# Patient Record
Sex: Male | Born: 1970 | Race: Black or African American | Hispanic: No | Marital: Married | Smoking: Former smoker
Health system: Southern US, Community
[De-identification: ages and names within clinical notes are randomized; demographics above are authoritative.]

---

## 2005-07-13 ENCOUNTER — Emergency Department (HOSPITAL_COMMUNITY): Admission: EM | Admit: 2005-07-13 | Discharge: 2005-07-13 | Payer: Self-pay | Admitting: Emergency Medicine

## 2006-09-07 ENCOUNTER — Emergency Department (HOSPITAL_COMMUNITY): Admission: EM | Admit: 2006-09-07 | Discharge: 2006-09-07 | Payer: Self-pay | Admitting: Family Medicine

## 2013-03-21 ENCOUNTER — Emergency Department (HOSPITAL_COMMUNITY): Payer: BC Managed Care – PPO

## 2013-03-21 ENCOUNTER — Encounter (HOSPITAL_COMMUNITY): Payer: Self-pay | Admitting: Emergency Medicine

## 2013-03-21 ENCOUNTER — Emergency Department (HOSPITAL_COMMUNITY)
Admission: EM | Admit: 2013-03-21 | Discharge: 2013-03-21 | Disposition: A | Discharge status: Home / Self Care | Payer: BC Managed Care – PPO | Attending: Emergency Medicine | Admitting: Emergency Medicine

## 2013-03-21 DIAGNOSIS — S298XXA Other specified injuries of thorax, initial encounter: Secondary | ICD-10-CM | POA: Insufficient documentation

## 2013-03-21 DIAGNOSIS — Y92410 Unspecified street and highway as the place of occurrence of the external cause: Secondary | ICD-10-CM | POA: Insufficient documentation

## 2013-03-21 DIAGNOSIS — Y9389 Activity, other specified: Secondary | ICD-10-CM | POA: Insufficient documentation

## 2013-03-21 DIAGNOSIS — R0789 Other chest pain: Secondary | ICD-10-CM

## 2013-03-21 NOTE — ED Notes (Signed)
Bed: RESB Expected date: 03/21/13 Expected time: 12:22 PM Means of arrival: Ambulance Comments: Hypertension, chest pain
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
## 2013-08-29 ENCOUNTER — Encounter: Payer: Self-pay | Admitting: *Deleted

## 2013-08-29 DIAGNOSIS — R9431 Abnormal electrocardiogram [ECG] [EKG]: Secondary | ICD-10-CM | POA: Insufficient documentation

## 2013-08-29 DIAGNOSIS — R002 Palpitations: Secondary | ICD-10-CM | POA: Insufficient documentation

## 2016-06-11 ENCOUNTER — Other Ambulatory Visit (HOSPITAL_COMMUNITY): Payer: Self-pay | Admitting: Nurse Practitioner

## 2016-06-11 DIAGNOSIS — B181 Chronic viral hepatitis B without delta-agent: Secondary | ICD-10-CM

## 2016-08-30 ENCOUNTER — Ambulatory Visit (HOSPITAL_COMMUNITY): Payer: BLUE CROSS/BLUE SHIELD

## 2016-09-12 ENCOUNTER — Other Ambulatory Visit (HOSPITAL_COMMUNITY): Payer: Self-pay

## 2016-10-10 ENCOUNTER — Ambulatory Visit (HOSPITAL_COMMUNITY): Payer: BLUE CROSS/BLUE SHIELD

## 2021-05-21 ENCOUNTER — Telehealth (HOSPITAL_COMMUNITY): Payer: Self-pay

## 2021-05-21 ENCOUNTER — Ambulatory Visit (HOSPITAL_COMMUNITY)
Admission: RE | Admit: 2021-05-21 | Discharge: 2021-05-21 | Disposition: A | Discharge status: Home / Self Care | Payer: 59 | Source: Ambulatory Visit | Attending: Emergency Medicine | Admitting: Emergency Medicine

## 2021-05-21 ENCOUNTER — Encounter (HOSPITAL_COMMUNITY): Payer: Self-pay

## 2021-05-21 ENCOUNTER — Other Ambulatory Visit: Payer: Self-pay

## 2021-05-21 VITALS — BP 168/76 | HR 76 | Temp 98.2°F | Resp 15

## 2021-05-21 DIAGNOSIS — M79602 Pain in left arm: Secondary | ICD-10-CM

## 2021-05-21 MED ORDER — ETODOLAC 400 MG PO TABS: 400.0000 mg | ORAL_TABLET | Freq: Two times a day (BID) | ORAL | 0 refills | Status: DC

## 2021-05-21 NOTE — Discharge Instructions (Addendum)
Do not lift anything heavy for the next few days.  You can take the Lodine twice a day as needed for pain. You can also take Tylenol as needed.  You may wear an arm brace or Ace bandage as needed for comfort.  Rest as much as possible Ice for 10-15 minutes every 4-6 hours as needed for pain and swelling Compression- use an ace bandage or splint for comfort Elevate above your hip/heart when sitting and laying down  Follow up with sports medicine or orthopedics if symptoms do not improve in the next few days.

## 2021-05-21 NOTE — ED Provider Notes (Signed)
[facility]    CSN: [id number] Arrival date & time: 05/21/21  1336      History   Chief Complaint Chief Complaint  Patient presents with   Arm Pain    HPI Joshua Hinton is a 50 y.o. male.   Patient here for evaluation of left forearm pain that has been ongoing for the past 2 weeks.  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  Reports taking Advil with minimal symptom relief.  Reports pain improves with rest and is worse with movement.  Reports pain is worse when flexing arm but patient does have full range of motion.  Denies any trauma, injury, or other precipitating event.  Denies any specific alleviating or aggravating factors.  Denies any fevers, chest pain, shortness of breath, N/V/D, numbness, tingling, weakness, abdominal pain, or headaches.    The history is provided by the patient.  Arm Pain   History reviewed. No pertinent past medical history.  Patient Active Problem List   Diagnosis Date Noted   Palpitations 08/29/2013   Abnormal EKG 08/29/2013    History reviewed. No pertinent surgical history.     Home Medications    Prior to Admission medications   Medication Sig Start Date End Date Taking? Authorizing Provider  etodolac (LODINE) 400 MG tablet Take 1 tablet (400 mg total) by mouth 2 (two) times daily. 05/21/21  Yes Ivette Loyal, NP    Family History Family History  Problem Relation Age of Onset   Cancer - Other Father     Social History Social History   Tobacco Use   Smoking status: Former    Types: Cigarettes    Quit date: 01/27/2003    Years since quitting: 18.3  Substance Use Topics   Alcohol use: Yes   Drug use: No     Allergies   Patient has no known allergies.   Review of Systems Review of Systems  Musculoskeletal:  Positive for arthralgias.  All other systems reviewed and are negative.   Physical Exam Triage Vital Signs ED Triage Vitals  Enc Vitals Group     BP  05/21/21 1415 (!) 168/76     Pulse Rate 05/21/21 1415 76     Resp 05/21/21 1415 15     Temp 05/21/21 1415 98.2 F (36.8 C)     Temp Source 05/21/21 1415 Oral     SpO2 05/21/21 1415 97 %     Weight --      Height --      Head Circumference --      Peak Flow --      Pain Score 05/21/21 1413 10     Pain Loc --      Pain Edu? --      Excl. in [place]? --    No data found.  Updated Vital Signs BP (!) 168/76 (BP Location: Right Arm)   Pulse 76   Temp 98.2 F (36.8 C) (Oral)   Resp 15   SpO2 97%   Visual Acuity Right Eye Distance:   Left Eye Distance:   Bilateral Distance:    Right Eye Near:   Left Eye Near:    Bilateral Near:     Physical Exam Vitals and nursing note reviewed.  Constitutional:      General: He is not in acute distress.    Appearance: Normal appearance. He is not ill-appearing, toxic-appearing or diaphoretic.  HENT:     Head: Normocephalic  and atraumatic.  Eyes:     Conjunctiva/sclera: Conjunctivae normal.  Cardiovascular:     Rate and Rhythm: Normal rate.     Pulses: Normal pulses.  Pulmonary:     Effort: Pulmonary effort is normal.  Abdominal:     General: Abdomen is flat.  Musculoskeletal:        General: Normal range of motion.     Left elbow: Swelling present. No effusion or lacerations. Normal range of motion. Tenderness (tenderness and mild swelling to proximal forearm) present.     Cervical back: Normal range of motion.  Skin:    General: Skin is warm and dry.  Neurological:     General: No focal deficit present.     Mental Status: He is alert and oriented to person, place, and time.  Psychiatric:        Mood and Affect: Mood normal.     UC Treatments / Results  Labs (all labs ordered are listed, but only abnormal results are displayed) Labs Reviewed - No data to display  EKG   Radiology No results found.  Procedures Procedures (including critical care time)  Medications Ordered in UC Medications - No data to  display  Initial Impression / Assessment and Plan / UC Course  I have reviewed the triage vital signs and the nursing notes.  Pertinent labs & imaging results that were available during my care of the patient were reviewed by me and considered in my medical decision making (see chart for details).    Assessment negative for red flags or concerns.  Left arm pain possibly due to pulled muscle.  Recommend not lifting anything heavy for the next few weeks.  May take Lodine twice a day as needed for pain.  May also take Tylenol.  Ace bandage or arm sling as needed for comfort.  Recommend rest, ice, compression, and elevation.  Follow-up with orthopedics if symptoms not improve in the next few days. Final Clinical Impressions(s) / UC Diagnoses   Final diagnoses:  Left arm pain     Discharge Instructions      Do not lift anything heavy for the next few days.  You can take the Lodine twice a day as needed for pain. You can also take Tylenol as needed.  You may wear an arm brace or Ace bandage as needed for comfort.  Rest as much as possible Ice for 10-15 minutes every 4-6 hours as needed for pain and swelling Compression- use an ace bandage or splint for comfort Elevate above your hip/heart when sitting and laying down  Follow up with sports medicine or orthopedics if symptoms do not improve in the next few days.       ED Prescriptions     Medication Sig Dispense Auth. Provider   etodolac (LODINE) 400 MG tablet Take 1 tablet (400 mg total) by mouth 2 (two) times daily. 28 tablet Ivette Loyal, NP      PDMP not reviewed this encounter.   Ivette Loyal, NP 05/21/21 [phone]

## 2021-05-21 NOTE — ED Triage Notes (Signed)
2 fridays ago pt was at work lifting something with another coworker and heard a pop in left arm. Taking Advil every 6 hours but not helping.

## 2021-06-01 ENCOUNTER — Other Ambulatory Visit (HOSPITAL_BASED_OUTPATIENT_CLINIC_OR_DEPARTMENT_OTHER): Payer: Self-pay | Admitting: Orthopaedic Surgery

## 2021-06-01 DIAGNOSIS — M25522 Pain in left elbow: Secondary | ICD-10-CM

## 2021-06-02 ENCOUNTER — Ambulatory Visit (HOSPITAL_BASED_OUTPATIENT_CLINIC_OR_DEPARTMENT_OTHER): Payer: 59 | Admitting: Orthopaedic Surgery

## 2021-07-06 ENCOUNTER — Encounter: Payer: Self-pay | Admitting: Orthopedic Surgery

## 2021-07-06 ENCOUNTER — Other Ambulatory Visit: Payer: Self-pay

## 2021-07-06 ENCOUNTER — Ambulatory Visit (INDEPENDENT_AMBULATORY_CARE_PROVIDER_SITE_OTHER): Payer: 59 | Admitting: Orthopedic Surgery

## 2021-07-06 DIAGNOSIS — S46212A Strain of muscle, fascia and tendon of other parts of biceps, left arm, initial encounter: Secondary | ICD-10-CM | POA: Insufficient documentation

## 2021-07-06 NOTE — Progress Notes (Signed)
Office Visit Note   Patient: Joshua Hinton           Date of Birth: 1971/06/15           MRN: [id number] Visit Date: 07/06/2021              Requested by: No referring provider defined for this encounter. PCP: Pcp, No   Assessment & Plan: Visit Diagnoses:  1. Rupture of left distal biceps tendon, initial encounter     Plan: Reviewed the MRI from outside hospital which demonstrates complete rupture of the left distal biceps tendon.  Discussed surgical treatment for this injury including nature of procedure and expected postoperative rehab and recovery. I will send him to my partner Dr. Merton Border who does more of these procedures.  Follow-Up Instructions: No follow-ups on file.   Orders:  Orders Placed This Encounter  Procedures   Ambulatory referral to Orthopedic Surgery   No orders of the defined types were placed in this encounter.     Procedures: No procedures performed   Clinical Data: No additional findings.   Subjective: Chief Complaint  Patient presents with   Right Elbow - Injury, Pain    This is a 50 year old left-hand-dominant male who presents with left elbow pain after an injury on October 25.  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  Since that time he had significant difficulty flexing the elbow.  He is seen in urgent care initially.  An MRI was obtained at [facility] on 11/25.  He is now 11 weeks out from injury.  He has been unable to perform his usual work duties secondary to the injury.  The pain is centered at the [facility] fossa of the radius distally into the proximal forearm.  Pain is worse with attempted flexion of the elbow or supination and flexed position.  Injury   Review of Systems   Objective: Vital Signs: There were no vitals taken for this visit.  Physical Exam Constitutional:      Appearance: Normal appearance.  Cardiovascular:     Rate and Rhythm: Normal  rate.     Pulses: Normal pulses.  Pulmonary:     Effort: Pulmonary effort is normal.  Skin:    General: Skin is warm and dry.     Capillary Refill: Capillary refill takes less than 2 seconds.  Neurological:     Mental Status: He is alert.    Left Elbow Exam   Tenderness  Left elbow tenderness location: TTP at [facility] fossa w/ moderate swelling/fullness.   Other  Erythema: absent Sensation: normal Pulse: present  Comments:  Questionable "hook test." ROM from 0-100 degrees but significant difficultly beyond 45 degrees of flexion.  Able to supinate the forearm with the elbow in a flexed position but painful.      Specialty Comments:  No specialty comments available.  Imaging: No results found.   PMFS History: Patient Active Problem List   Diagnosis Date Noted   Rupture of left distal biceps tendon 07/06/2021   Palpitations 08/29/2013   Abnormal EKG 08/29/2013   No past medical history on file.  Family History  Problem Relation Age of Onset   Cancer - Other Father     No past surgical history on file. Social History   Occupational History   Not on file  Tobacco Use   Smoking status: Former    Types: Cigarettes    Quit  date: 01/27/2003    Years since quitting: 18.4   Smokeless tobacco: Not on file  Substance and Sexual Activity   Alcohol use: Yes   Drug use: No   Sexual activity: Not on file

## 2021-07-18 ENCOUNTER — Ambulatory Visit (INDEPENDENT_AMBULATORY_CARE_PROVIDER_SITE_OTHER): Payer: 59 | Admitting: Orthopaedic Surgery

## 2021-07-18 ENCOUNTER — Other Ambulatory Visit: Payer: Self-pay

## 2021-07-18 DIAGNOSIS — S46212A Strain of muscle, fascia and tendon of other parts of biceps, left arm, initial encounter: Secondary | ICD-10-CM | POA: Diagnosis not present

## 2021-07-18 NOTE — Progress Notes (Signed)
Chief Complaint: Left elbow pain     History of Present Illness:    Joshua Hinton is a 50 y.o. male presents with left distal biceps tear after he was lifting an heavy object on May 14, 2021.  At that time he felt a pop and subsequent pain in the elbow.  An MRI was performed at [facility] which showed complete rupture of the distal biceps.  He was previously seen by Dr. Frazier Butt who recommend possible surgical fixation of his biceps.  He is here today for further discussion.  He has been on multiple medications for this including several NSAIDs as well as antispasmodics.  None of these appear to be working.  He does still notice weakness with basic activities and flexion of the left arm.  He is a Education administrator by [employer] History:   None  PMH/PSH/Family History/Social History/Meds/Allergies:   No past medical history on file. No past surgical history on file. Social History   Socioeconomic History   Marital status: Married    Spouse name: Not on file   Number of children: Not on file   Years of education: Not on file   Highest education level: Not on file  Occupational History   Not on file  Tobacco Use   Smoking status: Former    Types: Cigarettes    Quit date: 01/27/2003    Years since quitting: 18.4   Smokeless tobacco: Not on file  Substance and Sexual Activity   Alcohol use: Yes   Drug use: No   Sexual activity: Not on file  Other Topics Concern   Not on file  Social History Narrative   Not on file   Social Determinants of Health   Financial Resource Strain: Not on file  Food Insecurity: Not on file  Transportation Needs: Not on file  Physical Activity: Not on file  Stress: Not on file  Social Connections: Not on file   Family History  Problem Relation Age of Onset   Cancer - Other Father    No Known Allergies Current Outpatient Medications  Medication Sig Dispense Refill   etodolac (LODINE) 400 MG tablet Take  1 tablet (400 mg total) by mouth 2 (two) times daily. 28 tablet 0   No current facility-administered medications for this visit.   No results found.  Review of Systems:   A ROS was performed including pertinent positives and negatives as documented in the HPI.  Physical Exam :   Constitutional: NAD and appears stated age Neurological: Alert and oriented Psych: Appropriate affect and cooperative There were no vitals taken for this visit.   Comprehensive Musculoskeletal Exam:    Inspection Right Left  Skin No atrophy or gross abnormalities appreciated No atrophy or gross abnormalities appreciated  Palpation    Tenderness None None  Crepitus None None  Range of Motion    Flexion (passive) 160 160  Flexion (active) 160 160  Extension 0 0  Pronation normal normal  Supination normal normal   Strength    Flexion  5/5 5/5  Extension 5/5 5/5  Pronation  5/5 5/5  Supination  5/5 5/5  Special Tests    Lateral epicondyle pain with resisted wrist extension Negative Negative  Medial epicondyle pain with resisted wrist flexion  Negative Negative  Tinel test over cubital  tunnel  Negative  Negative   Instability    Generalized Laxity No No  Varus stress No instability No instability  Valgus stress  No instability No instability  Reflexes    Triceps Normal (2/4) Normal (2/4)  Brachioradialis Normal (2/4) Normal (2/4)  Biceps Normal (2/4) Normal (2/4)  Neurologic    Fires PIN, radial, median, ulnar, musculocutaneous, axillary, suprascapular, long thoracic, and spinal accessory innervated muscles. No abnormal sensibility.  Vascular/Lymphatic    Radial Pulse 2+ 2+  Cervical Exam    Patient has symmetric cervical range of motion with Negative Spurling's test.   No biceps palpated distally with hook test.  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   Assessment:   50 year old male with left full-thickness distal biceps tear after he was pulling up a heavy object.  I discussed with him operative versus nonoperative management.  At this time he is recovered to the effect that I do believe he is gotten the majority of the recovery that he would without surgery.  He still feels some weakness in the elbow.  I described this is consistent with having a distal biceps tear Uncommon to lose the terminal 10% of strength.  He is still symptomatic from this.  He is considering whether or not he would like to undergo left distal biceps tendon repair  Plan :    -Plan for possible left distal biceps tendon repair     I personally saw and evaluated the patient, and participated in the management and treatment plan.  Huel Cote, MD Attending Physician, Orthopedic Surgery  This document was dictated using Dragon voice recognition software. A reasonable attempt at proof reading has been made to minimize errors.

## 2021-08-17 ENCOUNTER — Encounter (HOSPITAL_COMMUNITY): Payer: Self-pay

## 2021-08-17 ENCOUNTER — Other Ambulatory Visit: Payer: Self-pay

## 2021-08-17 ENCOUNTER — Ambulatory Visit (HOSPITAL_COMMUNITY)
Admission: EM | Admit: 2021-08-17 | Discharge: 2021-08-17 | Disposition: A | Discharge status: Home / Self Care | Payer: 59 | Attending: Family Medicine | Admitting: Family Medicine

## 2021-08-17 DIAGNOSIS — R0789 Other chest pain: Secondary | ICD-10-CM | POA: Diagnosis not present

## 2021-08-17 NOTE — ED Triage Notes (Signed)
Pt presents with intermittent CP x 2-3 months.  Occurs about every week until today, has occurred twice today first time being after working out this morning.  Describes as a pain that goes from R chest to back as well as a tightness around his heart.  States he feels like his heart is going to stop.  No associated s/s incl dizziness, SOB, nausea, numbness or radiation of pain.  Family hx unknown, pt has no cardiac hx. Pt is under a little more stress than usual d/t injury and being out of work for a couple months.    Pt would like help getting set up with PCP.

## 2021-08-17 NOTE — Discharge Instructions (Signed)
Your EKG is normal today.  I do not see any sign of heart attack.

## 2021-08-17 NOTE — ED Provider Notes (Signed)
[facility]    CSN: [id number] Arrival date & time: 08/17/21  1421      History   Chief Complaint Chief Complaint  Patient presents with   Chest Pain    HPI Joshua Hinton is a 51 y.o. male.    Chest Pain Here for intermittent chest pain that is been bothering him for 2 to 3 months.  The pain will last as long as a few minutes.  No associated nausea, vomiting, diaphoresis, or shortness of breath.  It usually has been occurring 1 or 2 times a week, but today he had it 2 times.  1 time was after he had finished working out at the gym.  History reviewed. No pertinent past medical history.  Patient Active Problem List   Diagnosis Date Noted   Rupture of left distal biceps tendon 07/06/2021   Palpitations 08/29/2013   Abnormal EKG 08/29/2013    History reviewed. No pertinent surgical history.     Home Medications    Prior to Admission medications   Not on File    Family History Family History  Problem Relation Age of Onset   Cancer - Other Father     Social History Social History   Tobacco Use   Smoking status: Former    Types: Cigarettes    Quit date: 01/27/2003    Years since quitting: 18.5  Vaping Use   Vaping Use: Never used  Substance Use Topics   Alcohol use: Yes    Comment: occ   Drug use: No     Allergies   Patient has no known allergies.   Review of Systems Review of Systems  Cardiovascular:  Positive for chest pain.    Physical Exam Triage Vital Signs ED Triage Vitals  Enc Vitals Group     BP 08/17/21 1442 (!) 153/86     Pulse Rate 08/17/21 1442 79     Resp 08/17/21 1442 20     Temp 08/17/21 1442 98.9 F (37.2 C)     Temp Source 08/17/21 1442 Oral     SpO2 08/17/21 1442 98 %     Weight --      Height --      Head Circumference --      Peak Flow --      Pain Score 08/17/21 1445 0     Pain Loc --      Pain Edu? --      Excl. in [place]? --    No data found.  Updated Vital Signs BP (!) 153/86 (BP Location:  Left Arm)    Pulse 79    Temp 98.9 F (37.2 C) (Oral)    Resp 20    SpO2 98%   Visual Acuity Right Eye Distance:   Left Eye Distance:   Bilateral Distance:    Right Eye Near:   Left Eye Near:    Bilateral Near:     Physical Exam Vitals reviewed.  Constitutional:      General: He is not in acute distress.    Appearance: He is not ill-appearing, toxic-appearing or diaphoretic.  HENT:     Mouth/Throat:     Mouth: Mucous membranes are moist.  Eyes:     Extraocular Movements: Extraocular movements intact.     Conjunctiva/sclera: Conjunctivae normal.     Pupils: Pupils are equal, round, and reactive to light.  Cardiovascular:     Rate and Rhythm: Normal rate and regular rhythm.     Heart sounds: No  murmur heard. Pulmonary:     Effort: Pulmonary effort is normal.     Breath sounds: Normal breath sounds. No stridor. No wheezing, rhonchi or rales.  Musculoskeletal:     Cervical back: Neck supple.  Lymphadenopathy:     Cervical: No cervical adenopathy.  Skin:    Coloration: Skin is not jaundiced or pale.  Neurological:     Mental Status: He is oriented to person, place, and time.  Psychiatric:        Behavior: Behavior normal.     UC Treatments / Results  Labs (all labs ordered are listed, but only abnormal results are displayed) Labs Reviewed - No data to display  EKG   Radiology No results found.  Procedures Procedures (including critical care time)  Medications Ordered in UC Medications - No data to display  Initial Impression / Assessment and Plan / UC Course  I have reviewed the triage vital signs and the nursing notes.  Pertinent labs & imaging results that were available during my care of the patient were reviewed by me and considered in my medical decision making (see chart for details).     EKG shows NSR, no ST elevation or depression. Pt not currently hurting right now. Assistance requested to find PCP Final Clinical Impressions(s) / UC Diagnoses    Final diagnoses:  Atypical chest pain     Discharge Instructions      Your EKG is normal today.  I do not see any sign of heart attack.     ED Prescriptions   None    PDMP not reviewed this encounter.   Barrett Henle, MD 08/17/21 [phone]

## 2021-09-15 ENCOUNTER — Ambulatory Visit (HOSPITAL_COMMUNITY)
Admission: EM | Admit: 2021-09-15 | Discharge: 2021-09-15 | Disposition: A | Discharge status: Home / Self Care | Payer: 59 | Attending: Physician Assistant | Admitting: Physician Assistant

## 2021-09-15 ENCOUNTER — Other Ambulatory Visit: Payer: Self-pay

## 2021-09-15 ENCOUNTER — Encounter (HOSPITAL_COMMUNITY): Payer: Self-pay | Admitting: Emergency Medicine

## 2021-09-15 ENCOUNTER — Ambulatory Visit (INDEPENDENT_AMBULATORY_CARE_PROVIDER_SITE_OTHER): Payer: 59

## 2021-09-15 DIAGNOSIS — M25522 Pain in left elbow: Secondary | ICD-10-CM

## 2021-09-15 DIAGNOSIS — M79632 Pain in left forearm: Secondary | ICD-10-CM | POA: Diagnosis not present

## 2021-09-15 MED ORDER — PREDNISONE 20 MG PO TABS: 40.0000 mg | ORAL_TABLET | Freq: Every day | ORAL | 0 refills | Status: AC

## 2021-09-15 NOTE — ED Provider Notes (Signed)
[facility]    CSN: [id number] Arrival date & time: 09/15/21  0805      History   Chief Complaint Chief Complaint  Patient presents with   Elbow Pain    HPI Joshua Hinton is a 51 y.o. male.   Patient here today for evaluation of left forearm pain that started yesterday when he was turning his steering well.  He notes that he has continued to have some pain since injury in October at work but has been unable to have surgery as he is waiting on [employer]. to cover claim.  He denies any numbness or tingling.  He reports pain is increased with flexion of elbow.  The history is provided by the patient.   History reviewed. No pertinent past medical history.  Patient Active Problem List   Diagnosis Date Noted   Rupture of left distal biceps tendon 07/06/2021   Palpitations 08/29/2013   Abnormal EKG 08/29/2013    History reviewed. No pertinent surgical history.     Home Medications    Prior to Admission medications   Medication Sig Start Date End Date Taking? Authorizing Provider  predniSONE (DELTASONE) 20 MG tablet Take 2 tablets (40 mg total) by mouth daily with breakfast for 5 days. 09/15/21 09/20/21 Yes Tomi Bamberger, PA-C    Family History Family History  Problem Relation Age of Onset   Cancer - Other Father     Social History Social History   Tobacco Use   Smoking status: Former    Types: Cigarettes    Quit date: 01/27/2003    Years since quitting: 18.6  Vaping Use   Vaping Use: Never used  Substance Use Topics   Alcohol use: Yes    Comment: occ   Drug use: No     Allergies   Patient has no known allergies.   Review of Systems Review of Systems  Constitutional:  Negative for chills and fever.  Eyes:  Negative for discharge and redness.  Musculoskeletal:  Positive for arthralgias and myalgias.  Neurological:  Negative for numbness.    Physical Exam Triage Vital Signs ED Triage Vitals  Enc Vitals Group     BP 09/15/21  0828 133/82     Pulse Rate 09/15/21 0828 65     Resp 09/15/21 0828 17     Temp 09/15/21 0828 98.2 F (36.8 C)     Temp Source 09/15/21 0828 Oral     SpO2 09/15/21 0828 98 %     Weight --      Height --      Head Circumference --      Peak Flow --      Pain Score 09/15/21 0825 8     Pain Loc --      Pain Edu? --      Excl. in GC? --    No data found.  Updated Vital Signs BP 133/82 (BP Location: Right Arm)    Pulse 65    Temp 98.2 F (36.8 C) (Oral)    Resp 17    SpO2 98%   Physical Exam Vitals and nursing note reviewed.  Constitutional:      General: He is not in acute distress.    Appearance: Normal appearance. He is not ill-appearing.  HENT:     Head: Normocephalic and atraumatic.  Eyes:     Conjunctiva/sclera: Conjunctivae normal.  Cardiovascular:     Rate and Rhythm: Normal rate.  Pulmonary:     Effort: Pulmonary  effort is normal.  Musculoskeletal:     Comments: Full extension, decreased flexion of left elbow past 90 degrees due to pain. Normal ROM of left wrist and fingers  Skin:    Capillary Refill: Normal cap refill to left fingers Neurological:     Mental Status: He is alert.     Comments: Gross sensation intact to left fingers  Psychiatric:        Mood and Affect: Mood normal.        Behavior: Behavior normal.        Thought Content: Thought content normal.     UC Treatments / Results  Labs (all labs ordered are listed, but only abnormal results are displayed) Labs Reviewed - No data to display  EKG   Radiology DG Elbow Complete Left  Result Date: 09/15/2021 CLINICAL DATA:  Left elbow pain. Pt states he heard a "pop" while driving. Pt states pain is on lateral side of elbow joint. EXAM: LEFT ELBOW - COMPLETE 3+ VIEW COMPARISON:  None. FINDINGS: No acute fracture or dislocation. No joint effusion. Tiny chronic fracture fragment along the coronoid process. No aggressive osseous lesion. Normal alignment. Soft tissue are unremarkable. No radiopaque  foreign body or soft tissue emphysema. IMPRESSION: 1. No acute osseous injury of the left elbow. If there is clinical concern regarding soft tissue ligamentous, tendinous or muscle injury about the elbow, recommend a MRI of the left elbow. Electronically Signed   By: Elige Ko M.D.   On: 09/15/2021 09:12    Procedures Procedures (including critical care time)  Medications Ordered in UC Medications - No data to display  Initial Impression / Assessment and Plan / UC Course  I have reviewed the triage vital signs and the nursing notes.  Pertinent labs & imaging results that were available during my care of the patient were reviewed by me and considered in my medical decision making (see chart for details).    Xray without acute finding. Will trial short course of steroids to hopefully decrease inflammation and pain and recommended follow up with ortho if no improvement or if symptoms worsen given known injury (biceps tear).   Final Clinical Impressions(s) / UC Diagnoses   Final diagnoses:  Left forearm pain  Left elbow pain   Discharge Instructions   None    ED Prescriptions     Medication Sig Dispense Auth. Provider   predniSONE (DELTASONE) 20 MG tablet Take 2 tablets (40 mg total) by mouth daily with breakfast for 5 days. 10 tablet Tomi Bamberger, PA-C      PDMP not reviewed this encounter.   Tomi Bamberger, PA-C 09/15/21 [phone]
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
## 2021-10-16 ENCOUNTER — Ambulatory Visit (INDEPENDENT_AMBULATORY_CARE_PROVIDER_SITE_OTHER): Payer: 59 | Admitting: Internal Medicine

## 2021-10-16 ENCOUNTER — Other Ambulatory Visit: Payer: Self-pay

## 2021-10-16 ENCOUNTER — Encounter: Payer: Self-pay | Admitting: Internal Medicine

## 2021-10-16 VITALS — BP 132/84 | HR 74 | Temp 98.6°F | Ht 66.0 in | Wt 217.0 lb

## 2021-10-16 DIAGNOSIS — R0789 Other chest pain: Secondary | ICD-10-CM | POA: Diagnosis not present

## 2021-10-16 DIAGNOSIS — Z0000 Encounter for general adult medical examination without abnormal findings: Secondary | ICD-10-CM

## 2021-10-16 DIAGNOSIS — Z1211 Encounter for screening for malignant neoplasm of colon: Secondary | ICD-10-CM

## 2021-10-16 DIAGNOSIS — I10 Essential (primary) hypertension: Secondary | ICD-10-CM | POA: Diagnosis not present

## 2021-10-16 DIAGNOSIS — R9431 Abnormal electrocardiogram [ECG] [EKG]: Secondary | ICD-10-CM

## 2021-10-16 DIAGNOSIS — Z1159 Encounter for screening for other viral diseases: Secondary | ICD-10-CM

## 2021-10-16 LAB — CBC WITH DIFFERENTIAL/PLATELET
Basophils Absolute: 0 10*3/uL (ref 0.0–0.1)
Basophils Relative: 0.4 % (ref 0.0–3.0)
Eosinophils Absolute: 0.3 10*3/uL (ref 0.0–0.7)
Eosinophils Relative: 5.2 % — ABNORMAL HIGH (ref 0.0–5.0)
HCT: 41.6 % (ref 39.0–52.0)
Hemoglobin: 13.1 g/dL (ref 13.0–17.0)
Lymphocytes Relative: 32.6 % (ref 12.0–46.0)
Lymphs Abs: 1.9 10*3/uL (ref 0.7–4.0)
MCHC: 31.4 g/dL (ref 30.0–36.0)
MCV: 76.2 fl — ABNORMAL LOW (ref 78.0–100.0)
Monocytes Absolute: 0.5 10*3/uL (ref 0.1–1.0)
Monocytes Relative: 8.8 % (ref 3.0–12.0)
Neutro Abs: 3.1 10*3/uL (ref 1.4–7.7)
Neutrophils Relative %: 53 % (ref 43.0–77.0)
Platelets: 196 10*3/uL (ref 150.0–400.0)
RBC: 5.46 Mil/uL (ref 4.22–5.81)
RDW: 14.8 % (ref 11.5–15.5)
WBC: 5.8 10*3/uL (ref 4.0–10.5)
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
## 2021-10-16 NOTE — Patient Instructions (Signed)

## 2021-10-17 ENCOUNTER — Encounter: Payer: Self-pay | Admitting: Internal Medicine

## 2021-10-17 LAB — BASIC METABOLIC PANEL
BUN: 23 mg/dL (ref 6–23)
CO2: 25 mEq/L (ref 19–32)
Calcium: 9.5 mg/dL (ref 8.4–10.5)
Chloride: 105 mEq/L (ref 96–112)
Creatinine, Ser: 1.46 mg/dL (ref 0.40–1.50)
GFR: 56.1 mL/min — ABNORMAL LOW (ref >60.00)
Glucose, Bld: 88 mg/dL (ref 70–99)
Potassium: 4.2 mEq/L (ref 3.5–5.1)
Sodium: 136 mEq/L (ref 135–145)

## 2021-10-17 LAB — URINALYSIS, ROUTINE W REFLEX MICROSCOPIC
Bilirubin Urine: NEGATIVE
Hgb urine dipstick: NEGATIVE
Ketones, ur: NEGATIVE
Leukocytes,Ua: NEGATIVE
Nitrite: NEGATIVE
RBC / HPF: NONE SEEN (ref 0–?)
Specific Gravity, Urine: 1.02 (ref 1.000–1.030)
Total Protein, Urine: NEGATIVE
Urine Glucose: NEGATIVE
Urobilinogen, UA: 0.2 (ref 0.0–1.0)
WBC, UA: NONE SEEN (ref 0–?)
pH: 6.5 (ref 5.0–8.0)

## 2021-10-17 LAB — TSH: TSH: 1.21 u[IU]/mL (ref 0.35–5.50)

## 2021-10-17 LAB — LIPID PANEL
Cholesterol: 137 mg/dL (ref 0–200)
HDL: 40.3 mg/dL (ref >39.00)
LDL Cholesterol: 83 mg/dL (ref 0–99)
NonHDL: 96.3
Total CHOL/HDL Ratio: 3
Triglycerides: 68 mg/dL (ref 0.0–149.0)
VLDL: 13.6 mg/dL (ref 0.0–40.0)

## 2021-10-17 LAB — HEPATIC FUNCTION PANEL
ALT: 24 U/L (ref 0–53)
AST: 27 U/L (ref 0–37)
Albumin: 4.3 g/dL (ref 3.5–5.2)
Alkaline Phosphatase: 78 U/L (ref 39–117)
Bilirubin, Direct: 0 mg/dL (ref 0.0–0.3)
Total Bilirubin: 0.3 mg/dL (ref 0.2–1.2)
Total Protein: 7.3 g/dL (ref 6.0–8.3)

## 2021-10-17 LAB — HIV ANTIBODY (ROUTINE TESTING W REFLEX): HIV 1&2 Ab, 4th Generation: NONREACTIVE

## 2021-10-17 LAB — HEPATITIS C ANTIBODY
Hepatitis C Ab: NONREACTIVE
SIGNAL TO CUT-OFF: 0.09 (ref <1.00)

## 2021-10-17 LAB — PSA: PSA: 1.5 ng/mL (ref 0.10–4.00)

## 2021-10-17 MED ORDER — INDAPAMIDE 1.25 MG PO TABS: 1.2500 mg | ORAL_TABLET | Freq: Every day | ORAL | 0 refills | Status: DC

## 2021-10-20 ENCOUNTER — Encounter: Payer: Self-pay | Admitting: Internal Medicine

## 2021-12-29 ENCOUNTER — Telehealth: Payer: Self-pay | Admitting: *Deleted

## 2021-12-29 NOTE — Telephone Encounter (Signed)
Pt scheduled for in person PV today at 10:30am. Pt was a no show. Called pt, no answer. Message left to call back by 5pm to reschedule PreVisit otherwise colonoscopy would be cancelled.

## 2021-12-29 NOTE — Telephone Encounter (Signed)
Pt did not return call to reschedule PV, colon cancelled and no show letter sent.

## 2022-01-11 ENCOUNTER — Encounter: Payer: 59 | Admitting: Gastroenterology

## 2022-01-26 ENCOUNTER — Telehealth: Payer: Self-pay | Admitting: Internal Medicine

## 2022-01-26 NOTE — Telephone Encounter (Signed)
Patient coming in on Wednesday

## 2022-01-26 NOTE — Telephone Encounter (Signed)
Pt is requesting Varicella and hep b vaccine for traveling. Pt states vaccines are  needed  by 02/01/22.

## 2022-01-31 ENCOUNTER — Ambulatory Visit: Payer: 59

## 2022-01-31 ENCOUNTER — Ambulatory Visit (INDEPENDENT_AMBULATORY_CARE_PROVIDER_SITE_OTHER): Payer: 59

## 2022-01-31 DIAGNOSIS — Z23 Encounter for immunization: Secondary | ICD-10-CM
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
## 2023-10-12 ENCOUNTER — Encounter (HOSPITAL_COMMUNITY): Payer: Self-pay

## 2023-10-12 ENCOUNTER — Ambulatory Visit (HOSPITAL_COMMUNITY)
Admission: EM | Admit: 2023-10-12 | Discharge: 2023-10-12 | Disposition: A | Discharge status: Home / Self Care | Payer: Self-pay | Attending: Family Medicine | Admitting: Family Medicine

## 2023-10-12 DIAGNOSIS — S39012A Strain of muscle, fascia and tendon of lower back, initial encounter: Secondary | ICD-10-CM

## 2023-10-12 MED ORDER — IBUPROFEN 800 MG PO TABS: 800.0000 mg | ORAL_TABLET | Freq: Three times a day (TID) | ORAL | 0 refills | Status: AC

## 2023-10-12 MED ORDER — CYCLOBENZAPRINE HCL 10 MG PO TABS: ORAL_TABLET | ORAL | 0 refills | Status: AC
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
## 2023-10-17 ENCOUNTER — Encounter (HOSPITAL_COMMUNITY): Payer: Self-pay

## 2023-10-17 ENCOUNTER — Ambulatory Visit (INDEPENDENT_AMBULATORY_CARE_PROVIDER_SITE_OTHER): Payer: Self-pay

## 2023-10-17 ENCOUNTER — Ambulatory Visit (HOSPITAL_COMMUNITY)
Admission: RE | Admit: 2023-10-17 | Discharge: 2023-10-17 | Disposition: A | Discharge status: Home / Self Care | Payer: Self-pay | Source: Ambulatory Visit | Attending: Family Medicine | Admitting: Family Medicine

## 2023-10-17 VITALS — BP 123/83 | HR 56 | Temp 97.7°F | Resp 14

## 2023-10-17 DIAGNOSIS — M5410 Radiculopathy, site unspecified: Secondary | ICD-10-CM

## 2023-10-17 MED ORDER — PREDNISONE 10 MG PO TABS: 10.0000 mg | ORAL_TABLET | Freq: Every day | ORAL | 0 refills | Status: AC
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
## 2023-11-11 ENCOUNTER — Encounter (HOSPITAL_BASED_OUTPATIENT_CLINIC_OR_DEPARTMENT_OTHER): Payer: Self-pay | Admitting: Emergency Medicine

## 2023-11-11 ENCOUNTER — Emergency Department (HOSPITAL_BASED_OUTPATIENT_CLINIC_OR_DEPARTMENT_OTHER)
Admission: EM | Admit: 2023-11-11 | Discharge: 2023-11-11 | Disposition: A | Discharge status: Home / Self Care | Payer: Self-pay | Attending: Emergency Medicine | Admitting: Emergency Medicine

## 2023-11-11 ENCOUNTER — Emergency Department (HOSPITAL_BASED_OUTPATIENT_CLINIC_OR_DEPARTMENT_OTHER): Payer: Self-pay

## 2023-11-11 ENCOUNTER — Other Ambulatory Visit: Payer: Self-pay

## 2023-11-11 DIAGNOSIS — J40 Bronchitis, not specified as acute or chronic: Secondary | ICD-10-CM | POA: Insufficient documentation

## 2023-11-11 LAB — CBC WITH DIFFERENTIAL/PLATELET
Abs Immature Granulocytes: 0.01 10*3/uL (ref 0.00–0.07)
Basophils Absolute: 0 10*3/uL (ref 0.0–0.1)
Basophils Relative: 0 %
Eosinophils Absolute: 0.4 10*3/uL (ref 0.0–0.5)
Eosinophils Relative: 7 %
HCT: 40.1 % (ref 39.0–52.0)
Hemoglobin: 12.6 g/dL — ABNORMAL LOW (ref 13.0–17.0)
Immature Granulocytes: 0 %
Lymphocytes Relative: 37 %
Lymphs Abs: 2.2 10*3/uL (ref 0.7–4.0)
MCH: 23.7 pg — ABNORMAL LOW (ref 26.0–34.0)
MCHC: 31.4 g/dL (ref 30.0–36.0)
MCV: 75.5 fL — ABNORMAL LOW (ref 80.0–100.0)
Monocytes Absolute: 0.5 10*3/uL (ref 0.1–1.0)
Monocytes Relative: 8 %
Neutro Abs: 2.8 10*3/uL (ref 1.7–7.7)
Neutrophils Relative %: 48 %
Platelets: 198 10*3/uL (ref 150–400)
RBC: 5.31 MIL/uL (ref 4.22–5.81)
RDW: 14.6 % (ref 11.5–15.5)
WBC: 5.9 10*3/uL (ref 4.0–10.5)
nRBC: 0 % (ref 0.0–0.2)

## 2023-11-11 LAB — BASIC METABOLIC PANEL WITH GFR
Anion gap: 6 (ref 5–15)
BUN: 19 mg/dL (ref 6–20)
CO2: 24 mmol/L (ref 22–32)
Calcium: 8.9 mg/dL (ref 8.9–10.3)
Chloride: 109 mmol/L (ref 98–111)
Creatinine, Ser: 1.4 mg/dL — ABNORMAL HIGH (ref 0.61–1.24)
GFR, Estimated: >60 mL/min (ref >60)
Glucose, Bld: 111 mg/dL — ABNORMAL HIGH (ref 70–99)
Potassium: 3.9 mmol/L (ref 3.5–5.1)
Sodium: 139 mmol/L (ref 135–145)

## 2023-11-11 LAB — BRAIN NATRIURETIC PEPTIDE: B Natriuretic Peptide: 18.1 pg/mL (ref 0.0–100.0)

## 2023-11-11 MED ORDER — ALBUTEROL SULFATE HFA 108 (90 BASE) MCG/ACT IN AERS
2.0000 | INHALATION_SPRAY | Freq: Once | RESPIRATORY_TRACT | Status: AC
  Administered 2023-11-11: 2 via RESPIRATORY_TRACT
  Filled 2023-11-11: qty 6.7

## 2023-11-11 MED ORDER — IPRATROPIUM-ALBUTEROL 0.5-2.5 (3) MG/3ML IN SOLN
3.0000 mL | Freq: Once | RESPIRATORY_TRACT | Status: AC
  Administered 2023-11-11: 3 mL via RESPIRATORY_TRACT
  Filled 2023-11-11: qty 3

## 2023-11-11 MED ORDER — PREDNISONE 50 MG PO TABS
60.0000 mg | ORAL_TABLET | Freq: Once | ORAL | Status: AC
  Administered 2023-11-11: 60 mg via ORAL
  Filled 2023-11-11: qty 1

## 2023-11-11 MED ORDER — PREDNISONE 20 MG PO TABS: ORAL_TABLET | ORAL | 0 refills | Status: AC

## 2023-11-11 MED ORDER — AEROCHAMBER PLUS FLO-VU MISC
1.0000 | Freq: Once | Status: AC
  Administered 2023-11-11: 1
  Filled 2023-11-11: qty 1

## 2023-11-11 NOTE — ED Notes (Signed)
 Pt seen in room 15 for SOB. BLB dim on right ,sm expiratory wheeze in left lower. RR 11 , HR 86, SATS 100% on room air, pt in no distreaa.
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
## 2023-11-11 NOTE — ED Provider Notes (Signed)
 [place] EMERGENCY DEPARTMENT AT [facility] Provider Note   CSN: [id number] Arrival date & time: 11/11/23  0031     History  Chief Complaint  Patient presents with   Shortness of Breath    INA POUPARD is a 53 y.o. male.  53 year old male with only medical history of seasonal allergies who presents ER today with cough and shortness of breath.  Patient has had cough and increased congestion over the last few days but tonight he was wheezing and when he lay down he got more short of breath and normal.  He states that when he walked to the bathroom he got real dyspneic and was coughing and wheezing really bad and got sweaty and pale which was concerning so he came here for further evaluation.  Denies any leg swelling, cardiac history, fever, productive cough.  No other associated symptoms.  Never diagnosed with asthma, does not smoke, no recent travels.  No new medications recently.  Does over-the-counter allergy medicines and that is about it.   Shortness of Breath      Home Medications Prior to Admission medications   Medication Sig Start Date End Date Taking? Authorizing Provider  predniSONE  (DELTASONE ) 20 MG tablet 2 tabs po daily x 4 days 11/12/23  Yes Davonte Siebenaler, Reymundo Caulk, MD  cyclobenzaprine  (FLEXERIL ) 10 MG tablet Take 1 tablet by mouth before bed as needed for muscle spasm. Warning: May cause drowsiness. 10/12/23   Afton Albright, MD  ibuprofen  (ADVIL ) 800 MG tablet Take 1 tablet (800 mg total) by mouth 3 (three) times daily with meals. 10/12/23   Afton Albright, MD      Allergies    Bee pollen    Review of Systems   Review of Systems  Respiratory:  Positive for shortness of breath.     Physical Exam Updated Vital Signs BP (!) 149/86   Pulse 78   Temp 97.9 F (36.6 C) (Oral)   Resp 15   Ht 5\' 6"  (1.676 m)   Wt 98.4 kg   SpO2 100%   BMI 35.01 kg/m  Physical Exam Vitals and nursing note reviewed.  Constitutional:      Appearance: He is  well-developed.  HENT:     Head: Normocephalic and atraumatic.  Cardiovascular:     Rate and Rhythm: Normal rate.  Pulmonary:     Effort: Pulmonary effort is normal. No respiratory distress.     Breath sounds: Decreased breath sounds and wheezing present.  Abdominal:     General: There is no distension.  Musculoskeletal:        General: Normal range of motion.     Cervical back: Normal range of motion.     Right lower leg: No edema.     Left lower leg: No edema.  Neurological:     Mental Status: He is alert.     ED Results / Procedures / Treatments   Labs (all labs ordered are listed, but only abnormal results are displayed) Labs Reviewed  CBC WITH DIFFERENTIAL/PLATELET - Abnormal; Notable for the following components:      Result Value   Hemoglobin 12.6 (*)    MCV 75.5 (*)    MCH 23.7 (*)    All other components within normal limits  BASIC METABOLIC PANEL WITH GFR - Abnormal; Notable for the following components:   Glucose, Bld 111 (*)    Creatinine, Ser 1.40 (*)    All other components within normal limits  BRAIN NATRIURETIC PEPTIDE    EKG EKG  Interpretation Date/Time:  Monday November 11 2023 00:38:09 EDT Ventricular Rate:  80 PR Interval:  208 QRS Duration:  88 QT Interval:  361 QTC Calculation: 417 R Axis:   86  Text Interpretation: Sinus rhythm Borderline prolonged PR interval Nonspecific T abnormalities, inferior leads ST elev, probable normal early repol pattern TWI inferiorlyworse than previously Confirmed by Eve Hinders [phone]) on 11/11/2023 1:47:42 AM  Radiology DG Chest Port 1 View Result Date: 11/11/2023 CLINICAL DATA:  Shortness of breath. EXAM: PORTABLE CHEST 1 VIEW COMPARISON:  March 21, 2013 FINDINGS: The heart size and mediastinal contours are within normal limits. There is no evidence of acute infiltrate, pleural effusion or pneumothorax. The visualized skeletal structures are unremarkable. IMPRESSION: No active disease. Electronically Signed   By:  Virgle Grime M.D.   On: 11/11/2023 01:10    Procedures Procedures    Medications Ordered in ED Medications  albuterol  (VENTOLIN  HFA) 108 (90 Base) MCG/ACT inhaler 2 puff (has no administration in time range)  Aerochamber Plus device 1 each (has no administration in time range)  ipratropium-albuterol  (DUONEB) 0.5-2.5 (3) MG/3ML nebulizer solution 3 mL (3 mLs Nebulization Given 11/11/23 0138)  predniSONE  (DELTASONE ) tablet 60 mg (60 mg Oral Given 11/11/23 0120)    ED Course/ Medical Decision Making/ A&P                                 Medical Decision Making Amount and/or Complexity of Data Reviewed Labs: ordered. Radiology: ordered.  Risk Prescription drug management.   Workup reassuring. Significant improvement with duoneb/prednisone . Xr clear. No e/o cardiac causes, suspect bronchitis and will treat for same.   Final Clinical Impression(s) / ED Diagnoses Final diagnoses:  Bronchitis    Rx / DC Orders ED Discharge Orders          Ordered    predniSONE  (DELTASONE ) 20 MG tablet        11/11/23 0228              Keairra Bardon, Reymundo Caulk, MD 11/11/23 [phone]

## 2023-12-03 ENCOUNTER — Encounter: Payer: Self-pay | Admitting: Physician Assistant

## 2023-12-03 ENCOUNTER — Other Ambulatory Visit (INDEPENDENT_AMBULATORY_CARE_PROVIDER_SITE_OTHER): Payer: Self-pay

## 2023-12-03 ENCOUNTER — Ambulatory Visit (INDEPENDENT_AMBULATORY_CARE_PROVIDER_SITE_OTHER): Payer: Self-pay | Admitting: Physician Assistant

## 2023-12-03 DIAGNOSIS — M25561 Pain in right knee: Secondary | ICD-10-CM

## 2023-12-03 NOTE — Progress Notes (Signed)
   Office Visit Note   Patient: Joshua Hinton           Date of Birth: 08-20-1970           MRN: [id number] Visit Date: 12/03/2023              Requested by: Arcadio Knuckles, MD [address] [place],  [place] [address] PCP: Arcadio Knuckles, MD   Assessment & Plan: Visit Diagnoses: No diagnosis found.  Plan: duplicate  Follow-Up Instructions: No follow-ups on file.   Orders:  No orders of the defined types were placed in this encounter.  No orders of the defined types were placed in this encounter.     Procedures: No procedures performed   Clinical Data: No additional findings.   Subjective: No chief complaint on file.   HPI  Review of Systems   Objective: Vital Signs: There were no vitals taken for this visit.  Physical Exam  Ortho Exam  Specialty Comments:  No specialty comments available.  Imaging: No results found.   PMFS History: Patient Active Problem List   Diagnosis Date Noted   Routine general medical examination at a health care facility 10/16/2021   Need for hepatitis C screening test 10/16/2021   Primary hypertension 10/16/2021   Abnormal electrocardiogram (ECG) (EKG) 10/16/2021   Screen for colon cancer 10/16/2021   Abnormal EKG 08/29/2013   No past medical history on file.  Family History  Problem Relation Age of Onset   Hypertension Mother    Cancer - Other Father    Alcoholism Father     No past surgical history on file. Social History   Occupational History   Not on file  Tobacco Use   Smoking status: Former    Current packs/day: 0.00    Types: Cigarettes    Quit date: 01/27/2003    Years since quitting: 20.8   Smokeless tobacco: Not on file  Vaping Use   Vaping status: Never Used  Substance and Sexual Activity   Alcohol use: Yes    Alcohol/week: 14.0 standard drinks of alcohol    Types: 14 Shots of liquor per week    Comment: occ   Drug use: No   Sexual activity: Yes    Partners: Female
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
## 2024-01-03 ENCOUNTER — Ambulatory Visit: Payer: Self-pay | Admitting: Physician Assistant
